# Patient Record
Sex: Male | Born: 1960 | Race: White | Hispanic: No | Marital: Married | Smoking: Never smoker
Health system: Southern US, Community
[De-identification: ages and names within clinical notes are randomized; demographics above are authoritative.]

## PROBLEM LIST (undated history)

## (undated) DIAGNOSIS — N320 Bladder-neck obstruction: Secondary | ICD-10-CM

## (undated) DIAGNOSIS — N401 Enlarged prostate with lower urinary tract symptoms: Secondary | ICD-10-CM

## (undated) DIAGNOSIS — N138 Other obstructive and reflux uropathy: Secondary | ICD-10-CM

## (undated) DIAGNOSIS — R361 Hematospermia: Secondary | ICD-10-CM

## (undated) DIAGNOSIS — N400 Enlarged prostate without lower urinary tract symptoms: Secondary | ICD-10-CM

## (undated) DIAGNOSIS — E663 Overweight: Secondary | ICD-10-CM

## (undated) DIAGNOSIS — H53009 Unspecified amblyopia, unspecified eye: Secondary | ICD-10-CM

## (undated) HISTORY — DX: Unspecified amblyopia, unspecified eye: H53.009

## (undated) HISTORY — DX: Hematospermia: R36.1

## (undated) HISTORY — DX: Other obstructive and reflux uropathy: N13.8

## (undated) HISTORY — DX: Overweight: E66.3

## (undated) HISTORY — DX: Benign prostatic hyperplasia without lower urinary tract symptoms: N40.0

## (undated) HISTORY — DX: Benign prostatic hyperplasia with lower urinary tract symptoms: N40.1

## (undated) HISTORY — PX: HERNIA REPAIR: SHX51

## (undated) HISTORY — DX: Bladder-neck obstruction: N32.0

## (undated) HISTORY — PX: CHOLECYSTECTOMY: SHX55

---

## 2009-02-16 ENCOUNTER — Ambulatory Visit: Payer: Self-pay | Admitting: General Surgery

## 2013-10-06 ENCOUNTER — Ambulatory Visit: Payer: Self-pay | Admitting: Podiatrist

## 2013-10-13 ENCOUNTER — Ambulatory Visit (INDEPENDENT_AMBULATORY_CARE_PROVIDER_SITE_OTHER): Payer: BC Managed Care – PPO | Admitting: Podiatrist

## 2013-10-13 ENCOUNTER — Encounter: Payer: Self-pay | Admitting: Podiatrist

## 2013-10-13 ENCOUNTER — Ambulatory Visit (INDEPENDENT_AMBULATORY_CARE_PROVIDER_SITE_OTHER): Payer: BC Managed Care – PPO

## 2013-10-13 VITALS — BP 138/84 | HR 100 | Resp 16 | Ht 68.0 in | Wt 200.0 lb

## 2013-10-13 DIAGNOSIS — M79609 Pain in unspecified limb: Secondary | ICD-10-CM

## 2013-10-13 DIAGNOSIS — M79672 Pain in left foot: Secondary | ICD-10-CM

## 2013-10-13 DIAGNOSIS — M722 Plantar fascial fibromatosis: Secondary | ICD-10-CM

## 2013-10-13 MED ORDER — PREDNISONE 10 MG PO KIT: 10.0000 mg | PACK | Freq: Four times a day (QID) | ORAL | Status: DC

## 2013-10-13 MED ORDER — MELOXICAM 15 MG PO TABS: 15.0000 mg | ORAL_TABLET | Freq: Every day | ORAL | Status: DC

## 2013-10-13 NOTE — Progress Notes (Signed)
Subjective: Colin Morris presents today with his wife for pain on the left heel. Patient states it's been hurting on and off for about 5 years. He states that his knee walking or movement on the foot is uncomfortable and is especially uncomfortable after sleeping and after sitting for long periods of time. Patient strata water bottle and stretching exercises with no relief in symptoms. He was last seen by me for heel pain on the right foot of which 2 injections were necessary and he recovered well  No changes in past medical history medications or allergies from his previous visit. Review of systems reviewed and negative per patient Objective: Neurovascular status is intact and unchanged to the left foot. Negative Tinel sign is elicited. Pain on palpation is noted plantar medial aspect of the left heel at the insertion of the plantar fascia on the medial calcaneal tubercle. Assessment: Plantar fasciitis left  Plan: Injected the left heel with Kenalog and Marcaine mixture. Order prescription for a steroid Dosepak and meloxicam anti-inflammatory and gave instructions for use. Discussed of heel pain continues in 2-4 weeks hew will need another injection. Stretching and shoe gear changes are dispensed and patient will be seen back as needed.

## 2013-10-13 NOTE — Patient Instructions (Signed)

## 2013-11-30 ENCOUNTER — Encounter: Payer: Self-pay | Admitting: *Deleted

## 2013-12-01 ENCOUNTER — Encounter: Payer: Self-pay | Admitting: Podiatrist

## 2013-12-01 ENCOUNTER — Ambulatory Visit (INDEPENDENT_AMBULATORY_CARE_PROVIDER_SITE_OTHER): Payer: BC Managed Care – PPO | Admitting: Podiatrist

## 2013-12-01 VITALS — BP 142/91 | HR 95 | Resp 16 | Ht 68.0 in | Wt 202.0 lb

## 2013-12-01 DIAGNOSIS — M722 Plantar fascial fibromatosis: Secondary | ICD-10-CM

## 2013-12-01 MED ORDER — TRIAMCINOLONE ACETONIDE 40 MG/ML IJ SUSP
20.0000 mg | Freq: Once | INTRAMUSCULAR | Status: AC
  Administered 2013-12-01: 20 mg

## 2013-12-01 NOTE — Patient Instructions (Signed)
Continue stretching your foot with the stretching exercises.  Use your night splint for added relief.

## 2013-12-01 NOTE — Progress Notes (Signed)
  Subjective: Colin Morris presents today for continued heel pain left heel on the plantar fascial region. He states the last injection was helpful however the pain has returned. He also states he got over-the-counter inserts and he likes the cushioning in his shoe. He states he took the steroid Dosepak but he noticed no improvement in the foot.  Objective: Neurovascular status intact and unchanged. Pain along the plantar medial aspect of the left heel at the insertion of the plantar fascia on the medial calcaneal tubercle continues to be present. Mild inflammation and swelling in this region is also noted.  Assessment: Continued plantar fasciitis left  Plan: Under sterile technique I injected the left heel with Kenalog and Marcaine mixture without complication. I also recommended custom orthotics and he was Scanned at today's visit. I also recommended night splint which she declined. He is instructed to continue stretching exercises into the orthotics come in. We will call these are ready for pick up.   Marlowe Aschoff DPM

## 2014-01-09 ENCOUNTER — Telehealth: Payer: Self-pay | Admitting: *Deleted

## 2014-01-09 NOTE — Telephone Encounter (Signed)
Dr Irving [place], pts wife called wanting to know about inserts. He was scanned for orthotics but no prescription order was filled out. Can you please fill out one for me ASAP and i can get that sent over to [facility]. Thanks.

## 2014-01-14 ENCOUNTER — Emergency Department: Payer: Self-pay | Admitting: Emergency Medicine

## 2014-01-14 ENCOUNTER — Ambulatory Visit: Payer: Self-pay | Admitting: Student

## 2014-01-26 ENCOUNTER — Encounter: Payer: Self-pay | Admitting: Podiatrist

## 2014-01-26 ENCOUNTER — Ambulatory Visit (INDEPENDENT_AMBULATORY_CARE_PROVIDER_SITE_OTHER): Payer: BC Managed Care – PPO | Admitting: Podiatrist

## 2014-01-26 VITALS — BP 132/91 | HR 93 | Resp 16 | Ht 68.0 in | Wt 204.0 lb

## 2014-01-26 DIAGNOSIS — M722 Plantar fascial fibromatosis: Secondary | ICD-10-CM

## 2014-01-26 NOTE — Patient Instructions (Signed)

## 2014-01-27 NOTE — Progress Notes (Signed)
Patient presents today with his wife to pick up his orthotics. After trimming them in length they are noted to fit and contour his arch and foot well. Overall he states he's happy with the inserts.   Of note the patient's wife states that he was quoted insurance pay for the orthotics and then once they were ordered they are not. I will try to see what the issue is with this and get it resolved.

## 2015-04-07 NOTE — Consult Note (Signed)
Admit Diagnosis:   FINGER LACERATION: Onset Date: 14-Jan-2014, Status: Active, Description: FINGER LACERATION    Denies:   Home Medications: Medication Instructions Status  Lortab 5/325 325 mg-5 mg oral tablet 1 tab(s) orally every 6 hours, As Needed - for Pain. No driving or being in dangerous while using this medicine. Active  Keflex 500 mg oral capsule 1 cap(s) orally 4 times a day Active   Radiology Results:  Radiology Results: XRay:    31-Jan-15 11:23, Finger-Index (2nd Digit) Left Hand  Finger-Index (2nd Digit) Left Hand  REASON FOR EXAM:    injury/partial amputation  COMMENTS:       PROCEDURE: DXR - DXR FINGER INDEX 2ND DIGIT LT HA  - Jan 14 2014 11:23AM     CLINICAL DATA:  Partial amputation from a power to was small and to  the left index finger.    EXAM:  LEFT INDEX FINGER 2+V    COMPARISON:  None.    FINDINGS:  There is an oblique, comminuted fracture across the base of the  distal phalanx, which appears to spare the articular surface of the  DIP joint. The distal phalanx from the proximal shaft through the  tuft has been amputated along with soft tissues.    There is no radiopaque foreign body. The joints are normally  aligned.     IMPRESSION:  Comminuted, displaced fracture of the remaining base of the distal  phalanx of the left index finger, with the rest of the distal  phalanx being amputated along with the finger tip soft tissues. No  radiopaque foreign body.      Electronically Signed    By: Amie Portland M.D.    On: 01/14/2014 11:23         Verified By: Domenic Moras, M.D.,  LabUnknown:  PACS Image    NKA: None   General Aspect 54 y/o caucasian male in no acute distress.   Present Illness 54 y/o cacasian male who injured his left index finger finishing a door. Finger got caught in tool and noted immediate pain and bleeding. Patient states he noted how tool took his finger tip off. Patient is right hand dominant.   Case History  and Physical Exam:  Chief Complaint index finger pain, traumatic amputation   Past Medical Health Denies   Past Surgical History Denies   Family History Non-Contributory   HEENT EOM intact, good hearing, head atraumatic   Neck/Nodes Supple   Chest/Lungs No use of accessory muscles, normal non-labored breathing,   Breasts Not examined   Cardiovascular Normal Sinus Rhythm  good distal perfusion   Abdomen Benign   Genitalia Not examined   Rectal Not examined   Musculoskeletal Profuse bleeding from traumatic fingertip amputation left index finger. SILT median, ulnar and radial distribution. Able to perfrom a O-K sign. Good distal perfusion with 2+ radial and ulna pulses. Able to move wrist and fingers, Index finger ROM causing pain.   Neurological Grossly WNL   Skin Warm  Dry    Impression 54 y/o male who sustained traumatic fingertip amputation finishing a door at home.   Plan Procedure:  Patient, side, and injury site confirmed, verbal consent given by patient and family. Hand was cleaned with chlorhexidine and betadine. Digital block applied using lidocaine without epi. Hand was prepped and draped in usual sterile fashion. Wound edges cleaned. Wound irrigated with sterile normal saline. Revision amputation through DIP joint of traumatic left index finger tip amputation performed under local anesthesia. Hemostasis achieved using  electrocautery. Wound closure using local advancement flap using volar skin and subcutanous tissue flap. Local flap closed using absorbable suture. Sterile, bulky dressing applied. No complications.  - Keflex x5 days - Norco for pain control - NWB left hand - RTC in 5-7 days for wound check - Precuations and wound care instructions given to patient and family - Keep dressing dry and clean   Electronic Signatures: Freda MunroSeyler, Tilmon Wisehart M (MD)  (Signed 31-Jan-15 14:36)  Authored: Health Issues, Significant Events - History, Home Medications, Radiology  Results, Allergies, General Aspect/Present Illness, History and Physical Exam, Impression/Plan   Last Updated: 31-Jan-15 14:36 by Freda MunroSeyler, Ron Junco M (MD)

## 2015-06-13 ENCOUNTER — Encounter: Payer: Self-pay | Admitting: General Surgery

## 2015-06-25 ENCOUNTER — Ambulatory Visit: Payer: Self-pay | Admitting: General Surgery

## 2015-06-25 ENCOUNTER — Encounter: Payer: Self-pay | Admitting: General Surgery

## 2015-06-25 ENCOUNTER — Ambulatory Visit (INDEPENDENT_AMBULATORY_CARE_PROVIDER_SITE_OTHER): Payer: BLUE CROSS/BLUE SHIELD | Admitting: General Surgery

## 2015-06-25 VITALS — BP 132/66 | HR 86 | Resp 14 | Ht 68.0 in | Wt 200.0 lb

## 2015-06-25 DIAGNOSIS — Z800 Family history of malignant neoplasm of digestive organs: Secondary | ICD-10-CM | POA: Insufficient documentation

## 2015-06-25 MED ORDER — POLYETHYLENE GLYCOL 3350 17 GM/SCOOP PO POWD: ORAL | Status: DC

## 2015-06-25 NOTE — Progress Notes (Signed)
Patient ID: Colin Morris, male   DOB: 10/02/1961, 54 y.o.   MRN: [id number]  Chief Complaint  Patient presents with  . Colonoscopy    HPI Colin BrasRandall G Morris is a 54 y.o. male here today for a evaluation of a colonoscopy. Last colonoscopy was on 02/16/2009. Patient states no GI problems at this time. Patient states he moves his bowels daily.   HPI  Past Medical History  Diagnosis Date  . Prostate enlargement     Past Surgical History  Procedure Laterality Date  . Cholecystectomy    . Hernia repair      Family History  Problem Relation Age of Onset  . Cancer Father     colon  . Colon polyps Mother     Social History History  Substance Use Topics  . Smoking status: Never Smoker   . Smokeless tobacco: Current User    Types: Chew  . Alcohol Use: 0.0 oz/week    0 Standard drinks or equivalent per week     Comment: RARELY    No Known Allergies  Current Outpatient Prescriptions  Medication Sig Dispense Refill  . aspirin 81 MG tablet Take 81 mg by mouth daily.    . Multiple Vitamins-Minerals (MULTIVITAMIN PO) Take by mouth daily.    . Omega-3 Fatty Acids (FISH OIL PO) Take by mouth daily.    . polyethylene glycol powder (GLYCOLAX/MIRALAX) powder 255 grams one bottle for colonoscopy prep 255 g 0   No current facility-administered medications for this visit.    Review of Systems Review of Systems  Constitutional: Negative.   Respiratory: Negative.   Cardiovascular: Negative.   Gastrointestinal: Negative.     Blood pressure 132/66, pulse 86, resp. rate 14, height 5\' 8"  (1.727 m), weight 200 lb (90.719 kg).  Physical Exam Physical Exam  Constitutional: He is oriented to person, place, and time. He appears well-developed and well-nourished.  HENT:  Mouth/Throat: Oropharynx is clear and moist and mucous membranes are normal. No oral lesions.  Eyes: Conjunctivae are normal. No scleral icterus.  Neck: No thyromegaly present.  Cardiovascular: Normal rate, regular  rhythm and normal heart sounds.   Pulmonary/Chest: Effort normal and breath sounds normal.  Abdominal: Soft. Bowel sounds are normal.  Lymphadenopathy:    He has no cervical adenopathy.  Neurological: He is alert and oriented to person, place, and time.  Skin: Skin is warm and dry.    Data Reviewed Colonoscopy completed 02/16/2009 was notable for diverticulosis.  Assessment     History colon cancer.  Use of dipping tobacco.    Plan    Risks of head and neck cancer with tobacco use reviewed. Patient is aware.     Colonoscopy with possible biopsy/polypectomy prn: Information regarding the procedure, including its potential risks and complications (including but not limited to perforation of the bowel, which may require emergency surgery to repair, and bleeding) was verbally given to the patient. Educational information regarding lower instestinal endoscopy was given to the patient. Written instructions for how to complete the bowel prep using Miralax were provided. The importance of drinking ample fluids to avoid dehydration as a result of the prep emphasized.  [facility] It is okay for patient to continue 81 mg aspirin once daily. This patient has been asked to discontinue fish oil one week prior to procedure.    WGN:[id number],HQIONPCP:Bronstein,David   Earline MayotteByrnett, Jeffrey W 06/25/2015, 9:59 AM

## 2015-06-25 NOTE — Patient Instructions (Addendum)
Colonoscopy A colonoscopy is an exam to look at the entire large intestine (colon). This exam can help find problems such as tumors, polyps, inflammation, and areas of bleeding. The exam takes about 1 hour.  LET [facility] PROVIDER KNOW ABOUT:   Any allergies you have.  All medicines you are taking, including vitamins, herbs, eye drops, creams, and over-the-counter medicines.  Previous problems you or members of your family have had with the use of anesthetics.  Any blood disorders you have.  Previous surgeries you have had.  Medical conditions you have. RISKS AND COMPLICATIONS  Generally, this is a safe procedure. However, as with any procedure, complications can occur. Possible complications include:  Bleeding.  Tearing or rupture of the colon wall.  Reaction to medicines given during the exam.  Infection (rare). BEFORE THE PROCEDURE   Ask your health care provider about changing or stopping your regular medicines.  You may be prescribed an oral bowel prep. This involves drinking a large amount of medicated liquid, starting the day before your procedure. The liquid will cause you to have multiple loose stools until your stool is almost clear or light green. This cleans out your colon in preparation for the procedure.  Do not eat or drink anything else once you have started the bowel prep, unless your health care provider tells you it is safe to do so.  Arrange for someone to drive you home after the procedure. PROCEDURE   You will be given medicine to help you relax (sedative).  You will lie on your side with your knees bent.  A long, flexible tube with a light and camera on the end (colonoscope) will be inserted through the rectum and into the colon. The camera sends video back to a computer screen as it moves through the colon. The colonoscope also releases carbon dioxide gas to inflate the colon. This helps your health care provider see the area better.  During  the exam, your health care provider may take a small tissue sample (biopsy) to be examined under a microscope if any abnormalities are found.  The exam is finished when the entire colon has been viewed. AFTER THE PROCEDURE   Do not drive for 24 hours after the exam.  You may have a small amount of blood in your stool.  You may pass moderate amounts of gas and have mild abdominal cramping or bloating. This is caused by the gas used to inflate your colon during the exam.  Ask when your test results will be ready and how you will get your results. Make sure you get your test results. Document Released: 11/28/2000 Document Revised: 09/21/2013 Document Reviewed: 08/08/2013 [facility] Patient Information 2015 [employer] [place]. This information is not intended to replace advice given to you by your health care provider. Make sure you discuss any questions you have with your health care provider.  Patient [facility]. It is okay for patient to continue 81 mg aspirin once daily. This patient has been asked to discontinue fish oil one week prior to procedure.

## 2015-06-25 NOTE — H&P (Signed)
Patient ID: Colin Morris, male   DOB: 12/15/1960, 54 y.o.   MRN: [id number]  Chief Complaint  Patient presents with  . Colonoscopy    HPI Colin BrasRandall G Wagster is a 54 y.o. male here today for a evaluation of a colonoscopy. Last colonoscopy was on 02/16/2009. Patient states no GI problems at this time. Patient states he moves his bowels daily.   HPI  Past Medical History  Diagnosis Date  . Prostate enlargement     Past Surgical History  Procedure Laterality Date  . Cholecystectomy    . Hernia repair      Family History  Problem Relation Age of Onset  . Cancer Father     colon  . Colon polyps Mother     Social History History  Substance Use Topics  . Smoking status: Never Smoker   . Smokeless tobacco: Current User    Types: Chew  . Alcohol Use: 0.0 oz/week    0 Standard drinks or equivalent per week     Comment: RARELY    No Known Allergies  Current Outpatient Prescriptions  Medication Sig Dispense Refill  . aspirin 81 MG tablet Take 81 mg by mouth daily.    . Multiple Vitamins-Minerals (MULTIVITAMIN PO) Take by mouth daily.    . Omega-3 Fatty Acids (FISH OIL PO) Take by mouth daily.    . polyethylene glycol powder (GLYCOLAX/MIRALAX) powder 255 grams one bottle for colonoscopy prep 255 g 0   No current facility-administered medications for this visit.    Review of Systems Review of Systems  Constitutional: Negative.   Respiratory: Negative.   Cardiovascular: Negative.   Gastrointestinal: Negative.     Blood pressure 132/66, pulse 86, resp. rate 14, height 5\' 8"  (1.727 m), weight 200 lb (90.719 kg).  Physical Exam Physical Exam  Constitutional: He is oriented to person, place, and time. He appears well-developed and well-nourished.  HENT:  Mouth/Throat: Oropharynx is clear and moist and mucous membranes are normal. No oral lesions.  Eyes: Conjunctivae are normal. No scleral icterus.  Neck: No thyromegaly present.  Cardiovascular: Normal rate, regular  rhythm and normal heart sounds.   Pulmonary/Chest: Effort normal and breath sounds normal.  Abdominal: Soft. Bowel sounds are normal.  Lymphadenopathy:    He has no cervical adenopathy.  Neurological: He is alert and oriented to person, place, and time.  Skin: Skin is warm and dry.    Data Reviewed Colonoscopy completed 02/16/2009 was notable for diverticulosis.  Assessment     History colon cancer.  Use of dipping tobacco.    Plan    Risks of head and neck cancer with tobacco use reviewed. Patient is aware.     Colonoscopy with possible biopsy/polypectomy prn: Information regarding the procedure, including its potential risks and complications (including but not limited to perforation of the bowel, which may require emergency surgery to repair, and bleeding) was verbally given to the patient. Educational information regarding lower instestinal endoscopy was given to the patient. Written instructions for how to complete the bowel prep using Miralax were provided. The importance of drinking ample fluids to avoid dehydration as a result of the prep emphasized.  Patient has been [facility]. It is okay for patient to continue 81 mg aspirin once daily. This patient has been asked to discontinue fish oil one week prior to procedure.    WUJ:[id number],NFAOZPCP:Bronstein,David

## 2015-06-26 ENCOUNTER — Encounter: Payer: Self-pay | Admitting: General Surgery

## 2015-08-15 ENCOUNTER — Telehealth: Payer: Self-pay | Admitting: *Deleted

## 2015-08-15 NOTE — Telephone Encounter (Signed)
Patient's wife was contacted today and she confirms no change in medications for the patient since last office visit. She was also reminded to have patient stop fish oil if he has not done so already.   We will proceed with colonoscopy as scheduled for 08-22-15 at [facility].  Patient's wife was instructed to call the office should they have further questions.

## 2015-08-22 ENCOUNTER — Encounter: Payer: Self-pay | Admitting: *Deleted

## 2015-08-22 ENCOUNTER — Encounter: Payer: Self-pay | Admitting: General Surgery

## 2015-08-22 ENCOUNTER — Ambulatory Visit
Admission: RE | Admit: 2015-08-22 | Discharge: 2015-08-22 | Disposition: A | Discharge status: Home / Self Care | Payer: BLUE CROSS/BLUE SHIELD | Source: Ambulatory Visit | Attending: General Surgery | Admitting: General Surgery

## 2015-08-22 ENCOUNTER — Ambulatory Visit: Payer: BLUE CROSS/BLUE SHIELD | Admitting: Certified Registered Nurse Anesthetist

## 2015-08-22 ENCOUNTER — Encounter
Admission: RE | Disposition: A | Discharge status: Home / Self Care | Payer: Self-pay | Source: Ambulatory Visit | Attending: General Surgery

## 2015-08-22 DIAGNOSIS — Z86010 Personal history of colonic polyps: Secondary | ICD-10-CM | POA: Diagnosis not present

## 2015-08-22 DIAGNOSIS — Z1211 Encounter for screening for malignant neoplasm of colon: Secondary | ICD-10-CM | POA: Diagnosis present

## 2015-08-22 DIAGNOSIS — N400 Enlarged prostate without lower urinary tract symptoms: Secondary | ICD-10-CM | POA: Diagnosis not present

## 2015-08-22 DIAGNOSIS — Z800 Family history of malignant neoplasm of digestive organs: Secondary | ICD-10-CM

## 2015-08-22 HISTORY — PX: COLONOSCOPY WITH PROPOFOL: SHX5780

## 2015-08-22 SURGERY — COLONOSCOPY WITH PROPOFOL
Anesthesia: General

## 2015-08-22 MED ORDER — PROPOFOL 10 MG/ML IV BOLUS
INTRAVENOUS | Status: DC | PRN
  Administered 2015-08-22: 50 mg via INTRAVENOUS

## 2015-08-22 MED ORDER — PROPOFOL INFUSION 10 MG/ML OPTIME
INTRAVENOUS | Status: DC | PRN
  Administered 2015-08-22: 160 ug/kg/min via INTRAVENOUS

## 2015-08-22 MED ORDER — SODIUM CHLORIDE 0.9 % IV SOLN
INTRAVENOUS | Status: DC
  Administered 2015-08-22: 1000 mL via INTRAVENOUS

## 2015-08-22 MED ORDER — LIDOCAINE HCL (CARDIAC) 20 MG/ML IV SOLN
INTRAVENOUS | Status: DC | PRN
  Administered 2015-08-22: 60 mg via INTRAVENOUS

## 2015-08-22 NOTE — Anesthesia Preprocedure Evaluation (Signed)
Anesthesia Evaluation  Patient identified by MRN, date of birth, ID band Patient awake    Reviewed: Allergy & Precautions, H&P , NPO status , Patient's Chart, lab work & pertinent test results, reviewed documented beta blocker date and time   History of Anesthesia Complications Negative for: history of anesthetic complications  Airway Mallampati: II  TM Distance: >3 FB Neck ROM: full    Dental no notable dental hx. (+) Teeth Intact   Pulmonary neg pulmonary ROS,    Pulmonary exam normal breath sounds clear to auscultation       Cardiovascular Exercise Tolerance: Good negative cardio ROS Normal cardiovascular exam Rhythm:regular Rate:Normal     Neuro/Psych negative neurological ROS  negative psych ROS   GI/Hepatic negative GI ROS, Neg liver ROS,   Endo/Other  negative endocrine ROS  Renal/GU negative Renal ROS  negative genitourinary   Musculoskeletal   Abdominal   Peds  Hematology negative hematology ROS (+)   Anesthesia Other Findings Past Medical History:   Prostate enlargement                                         Reproductive/Obstetrics negative OB ROS                             Anesthesia Physical Anesthesia Plan  ASA: I  Anesthesia Plan: General   Post-op Pain Management:    Induction:   Airway Management Planned:   Additional Equipment:   Intra-op Plan:   Post-operative Plan:   Informed Consent: I have reviewed the patients History and Physical, chart, labs and discussed the procedure including the risks, benefits and alternatives for the proposed anesthesia with the patient or authorized representative who has indicated his/her understanding and acceptance.   Dental Advisory Given  Plan Discussed with: Anesthesiologist, CRNA and Surgeon  Anesthesia Plan Comments:         Anesthesia Quick Evaluation

## 2015-08-22 NOTE — Transfer of Care (Signed)
Immediate Anesthesia Transfer of Care Note  Patient: Colin Morris  Procedure(s) Performed: Procedure(s): COLONOSCOPY WITH PROPOFOL (N/A)  Patient Location: PACU   Anesthesia Type:General  Level of Consciousness: awake, alert  and oriented  Airway & Oxygen Therapy: Patient Spontanous Breathing and Patient connected to nasal cannula oxygen  Post-op Assessment: Report given to RN and Post -op Vital signs reviewed and stable  Post vital signs: Reviewed and stable  Last Vitals:  Filed Vitals:   08/22/15 0933  BP: 136/80  Pulse: 78  Temp: 36.9 C  Resp: 18  1045 VS: BP 117/75 Pulse 77 Resp 18  Complications: No apparent anesthesia complications

## 2015-08-22 NOTE — Anesthesia Procedure Notes (Signed)
Performed by: Rashi Granier Pre-anesthesia Checklist: Patient identified, Suction available, Emergency Drugs available, Patient being monitored and Timeout performed Patient Re-evaluated:Patient Re-evaluated prior to inductionOxygen Delivery Method: Nasal cannula Intubation Type: IV induction       

## 2015-08-22 NOTE — Op Note (Signed)
[facility] Patient Name: Colin Morris Procedure Date: 08/22/2015 10:18 AM MRN: [id number] Account #: [id number] Date of Birth: [birth year] Admit Type: Outpatient Age: 54 Room: [facility] ENDO ROOM 4 Gender: Male Note Status: Finalized Procedure:         Colonoscopy Indications:       Screening for colorectal malignant neoplasm Providers:         Earline Mayotte, MD Referring MD:      Teena Irani. Terance Hart, MD (Referring MD) Medicines:         Monitored Anesthesia Care Complications:     No immediate complications. Procedure:         Pre-Anesthesia Assessment:                    - Prior to the procedure, a History and Physical was                     performed, and patient medications, allergies and                     sensitivities were reviewed. The patient's tolerance of                     previous anesthesia was reviewed.                    - The risks and benefits of the procedure and the sedation                     options and risks were discussed with the patient. All                     questions were answered and informed consent was obtained.                    After obtaining informed consent, the colonoscope was                     passed under direct vision. Throughout the procedure, the                     patient's blood pressure, pulse, and oxygen saturations                     were monitored continuously. The Olympus CF-Q160AL                     colonoscope (S#. [id number]) was introduced through the anus                     and advanced to the the terminal ileum. The colonoscopy                     was performed without difficulty. The patient tolerated                     the procedure well. The quality of the bowel preparation                     was excellent. Findings:      The perianal and digital rectal examinations were normal.      The entire examined colon appeared normal on direct and retroflexion        views. Impression:        - The entire  examined colon is normal on direct and                     retroflexion views.                    - No specimens collected. Recommendation:    - Repeat colonoscopy in 10 years for screening purposes. Diagnosis Code(s): --- Professional ---                    Z12.11, Encounter for screening for malignant neoplasm of                     colon Earline Mayotte, MD 08/22/2015 10:41:35 AM This report has been signed electronically. Number of Addenda: 0 Note Initiated On: 08/22/2015 10:18 AM Scope Withdrawal Time: 0 hours 10 minutes 1 second  Total Procedure Duration: 0 hours 16 minutes 13 seconds       [facility]

## 2015-08-22 NOTE — H&P (Signed)
OREST DYGERT [id number] [birth year]     HPI: Healthy 54 year old male for screening colonoscopy.  No change in health status since original examination.      No Known Allergies Past Medical History  Diagnosis Date  . Prostate enlargement    Past Surgical History  Procedure Laterality Date  . Cholecystectomy    . Hernia repair     Social History   Social History  . Marital Status: Married    Spouse Name: N/A  . Number of Children: N/A  . Years of Education: N/A   Occupational History  . Not on file.   Social History Main Topics  . Smoking status: Never Smoker   . Smokeless tobacco: Current User    Types: Chew  . Alcohol Use: 0.0 oz/week    0 Standard drinks or equivalent per week     Comment: RARELY  . Drug Use: No  . Sexual Activity: Not on file   Other Topics Concern  . Not on file   Social History Narrative   Social History   Social History Narrative     ROS: Negative.     PE: HEENT: Negative. Lungs: Clear. Cardio: RR. Earline Mayotte 08/22/2015   Assessment/Plan:  Proceed with planned endoscopy.

## 2015-08-24 ENCOUNTER — Encounter: Payer: Self-pay | Admitting: General Surgery

## 2015-08-24 NOTE — Anesthesia Postprocedure Evaluation (Signed)
  Anesthesia Post-op Note  Patient: Colin Morris  Procedure(s) Performed: Procedure(s): COLONOSCOPY WITH PROPOFOL (N/A)  Anesthesia type:General  Patient location: PACU  Post pain: Pain level controlled  Post assessment: Post-op Vital signs reviewed, Patient's Cardiovascular Status Stable, Respiratory Function Stable, Patent Airway and No signs of Nausea or vomiting  Post vital signs: Reviewed and stable  Last Vitals:  Filed Vitals:   08/22/15 1115  BP: 127/94  Pulse: 76  Temp:   Resp: 13    Level of consciousness: awake, alert  and patient cooperative  Complications: No apparent anesthesia complications

## 2015-09-14 ENCOUNTER — Encounter: Payer: Self-pay | Admitting: General Surgery

## 2015-09-28 IMAGING — CR LEFT INDEX FINGER 2+V
1 series · 3 of 3 positions shown · non-contrast
Comparison: None.

CLINICAL DATA: Partial amputation from a power to was small and to
the left index finger.

EXAM:
LEFT INDEX FINGER 2+V

[Series 1: pa · 0.17mm/px · 3 of 3 slices shown]
[im 1/3]
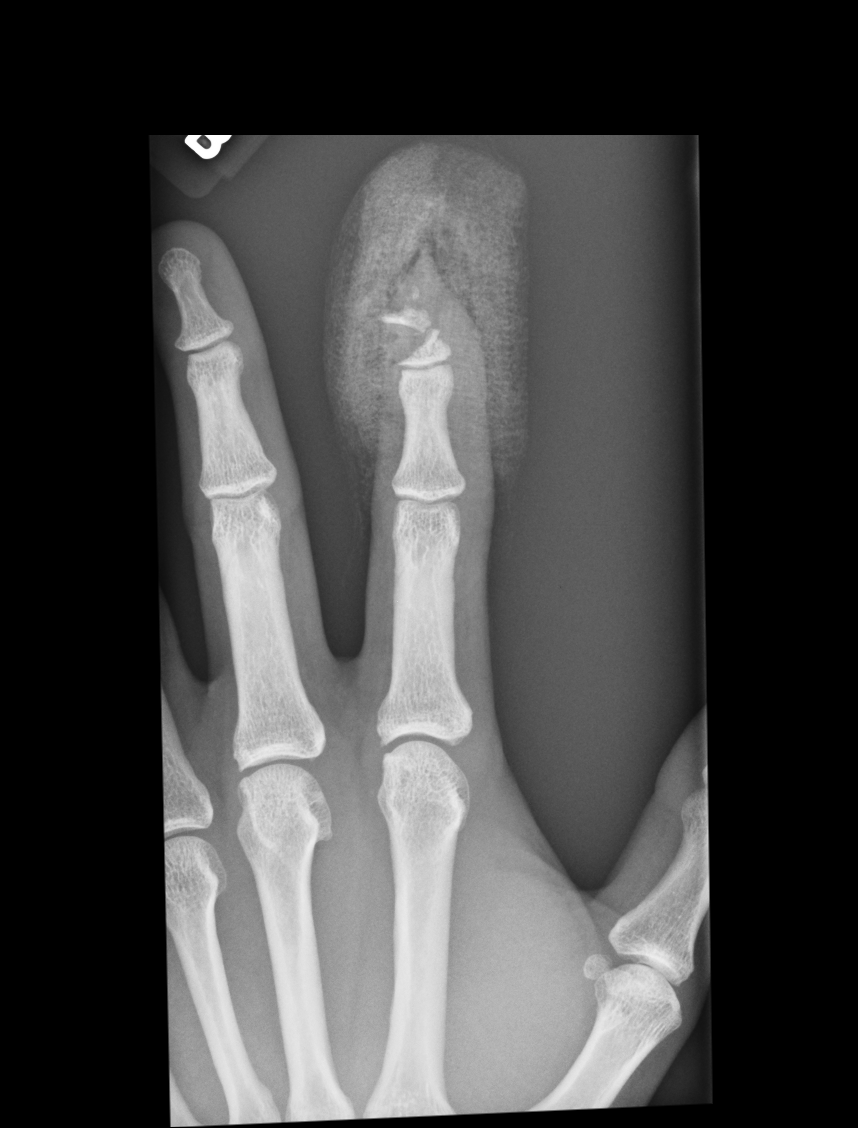
[im 2/3]
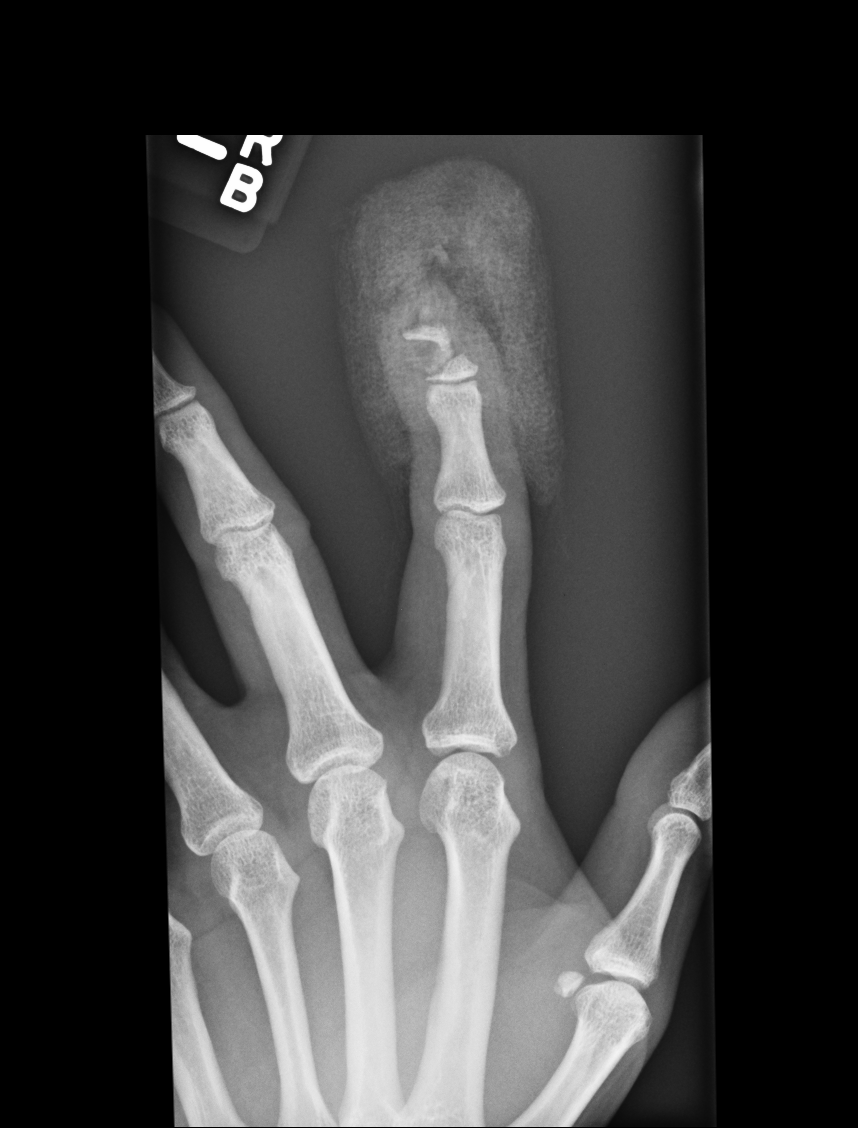
[im 3/3]
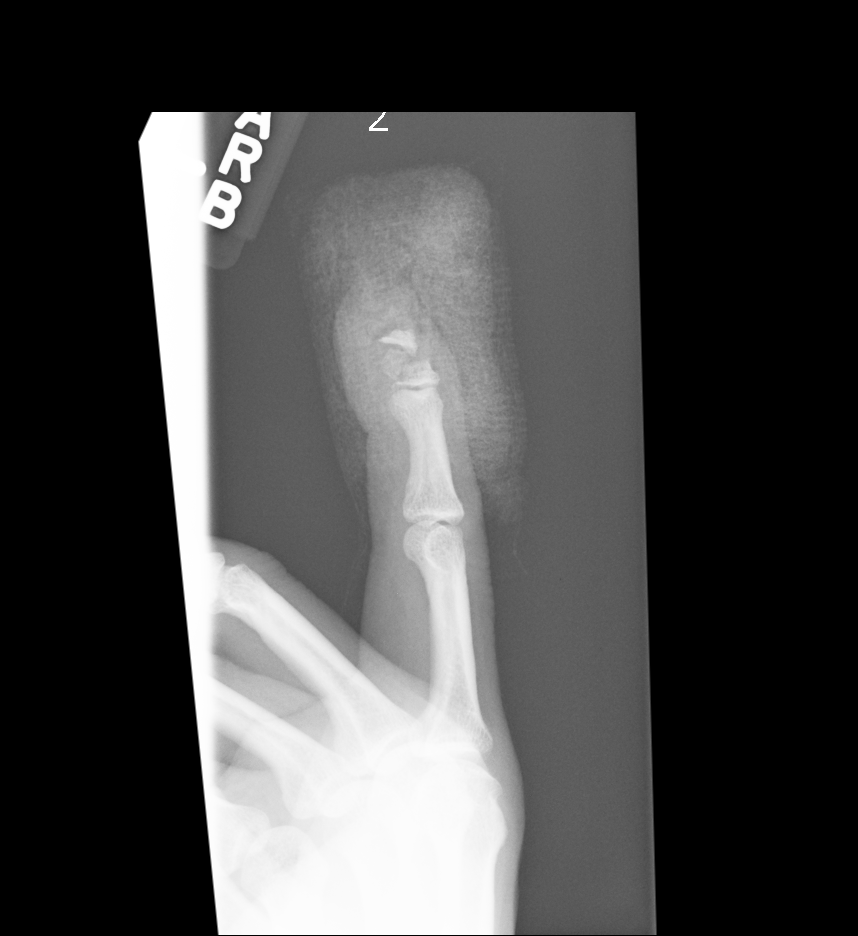

[3 of 3 positions shown; findings below may reference images not displayed]

FINDINGS: There is an oblique, comminuted fracture across the base of the
distal phalanx, which appears to spare the articular surface of the
DIP joint. The distal phalanx from the proximal shaft through the
tuft has been amputated along with soft tissues.

There is no radiopaque foreign body. The joints are normally
aligned.
IMPRESSION: Comminuted, displaced fracture of the remaining base of the distal
phalanx of the left index finger, with the rest of the distal
phalanx being amputated along with the finger tip soft tissues. No
radiopaque foreign body.

## 2016-01-22 ENCOUNTER — Encounter: Payer: Self-pay | Admitting: *Deleted

## 2016-01-25 ENCOUNTER — Encounter: Payer: Self-pay | Admitting: Urology

## 2016-01-25 ENCOUNTER — Ambulatory Visit (INDEPENDENT_AMBULATORY_CARE_PROVIDER_SITE_OTHER): Payer: BLUE CROSS/BLUE SHIELD | Admitting: Urology

## 2016-01-25 VITALS — BP 130/82 | HR 80 | Ht 68.0 in | Wt 198.5 lb

## 2016-01-25 DIAGNOSIS — N401 Enlarged prostate with lower urinary tract symptoms: Secondary | ICD-10-CM | POA: Diagnosis not present

## 2016-01-25 DIAGNOSIS — N138 Other obstructive and reflux uropathy: Secondary | ICD-10-CM

## 2016-01-25 NOTE — Progress Notes (Signed)
01/25/2016 8:42 AM   Colin Morris 10/27/1961 [id number]  Referring provider: Dorothey Baseman, MD [address] [place], [place] [address]  Chief Complaint  Patient presents with  . Benign Prostatic Hypertrophy    1 year follow up    HPI: Patient is a 55 year old Caucasian male with BPH with LUTS who presents today for a yearly follow up.    BPH WITH LUTS His IPSS score today is 3, which is mild lower urinary tract symptomatology. He is delighted with his quality life due to his urinary symptoms.  He denies any dysuria, hematuria or suprapubic pain.   He also denies any recent fevers, chills, nausea or vomiting.  He does not have a family history of PCa.      IPSS      01/25/16 0800       International Prostate Symptom Score   How often have you had the sensation of not emptying your bladder? Less than 1 in 5     How often have you had to urinate less than every two hours? Less than 1 in 5 times     How often have you found you stopped and started again several times when you urinated? Not at All     How often have you found it difficult to postpone urination? Not at All     How often have you had a weak urinary stream? Not at All     How often have you had to strain to start urination? Not at All     How many times did you typically get up at night to urinate? 1 Time     Total IPSS Score 3     Quality of Life due to urinary symptoms   If you were to spend the rest of your life with your urinary condition just the way it is now how would you feel about that? Delighted        Score:  1-7 Mild 8-19 Moderate 20-35 Severe      PMH: Past Medical History  Diagnosis Date  . Prostate enlargement   . Lazy eye   . BPH with obstruction/lower urinary tract symptoms   . Over weight   . Hematospermia   . Bladder outlet obstruction     Surgical History: Past Surgical History  Procedure Laterality Date  . Cholecystectomy    . Hernia repair    . Colonoscopy  with propofol N/A 08/22/2015    Procedure: COLONOSCOPY WITH PROPOFOL;  Surgeon: Earline Mayotte, MD;  Location: [facility] ENDOSCOPY;  Service: Endoscopy;  Laterality: N/A;    Home Medications:    Medication List       This list is accurate as of: 01/25/16  8:42 AM.  Always use your most recent med list.               aspirin 81 MG tablet  Take 81 mg by mouth daily.     FISH OIL PO  Take by mouth daily.     MULTIVITAMIN PO  Take by mouth daily.        Allergies: No Known Allergies  Family History: Family History  Problem Relation Age of Onset  . Cancer Father     colon  . Colon polyps Mother   . Heart disease Father   . Diabetes Mellitus II Father   . Kidney disease Neg Hx   . Prostate cancer Neg Hx     Social History:  reports  that he has never smoked. His smokeless tobacco use includes Chew. He reports that he drinks alcohol. He reports that he does not use illicit drugs.  ROS: UROLOGY Frequent Urination?: No Hard to postpone urination?: No Burning/pain with urination?: No Get up at night to urinate?: No Leakage of urine?: No Urine stream starts and stops?: No Trouble starting stream?: No Do you have to strain to urinate?: No Blood in urine?: No Urinary tract infection?: No Sexually transmitted disease?: No Injury to kidneys or bladder?: No Painful intercourse?: No Weak stream?: No Erection problems?: No Penile pain?: No  Gastrointestinal Nausea?: No Vomiting?: No Indigestion/heartburn?: No Diarrhea?: No Constipation?: No  Constitutional Fever: No Night sweats?: No Weight loss?: No Fatigue?: No  Skin Skin rash/lesions?: No Itching?: No  Eyes Blurred vision?: No Double vision?: No  Ears/Nose/Throat Sore throat?: No Sinus problems?: No  Hematologic/Lymphatic Swollen glands?: No Easy bruising?: No  Cardiovascular Leg swelling?: No Chest pain?: No  Respiratory Cough?: No Shortness of breath?: No  Endocrine Excessive thirst?:  No  Musculoskeletal Back pain?: No Joint pain?: No  Neurological Headaches?: No Dizziness?: No  Psychologic Depression?: No Anxiety?: No  Physical Exam: BP 130/82 mmHg  Pulse 80  Ht  (1.727 m)  Wt 198 lb 8 oz (90.039 kg)  BMI 30.19 kg/m2  Constitutional: Well nourished. Alert and oriented, No acute distress. HEENT: [place] AT, moist mucus membranes. Trachea midline, no masses. Cardiovascular: No clubbing, cyanosis, or edema. Respiratory: Normal respiratory effort, no increased work of breathing. GI: Abdomen is soft, non tender, non distended, no abdominal masses. Liver and spleen not palpable.  No hernias appreciated.  Stool sample for occult testing is not indicated.   GU: No CVA tenderness.  No bladder fullness or masses.  Patient with circumcised phallus.  Urethral meatus is patent.  No penile discharge. No penile lesions or rashes. Scrotum without lesions, cysts, rashes and/or edema.  Testicles are located scrotally bilaterally. No masses are appreciated in the testicles. Left and right epididymis are normal. Rectal: Patient with  normal sphincter tone. Anus and perineum without scarring or rashes. No rectal masses are appreciated. Prostate is approximately 40 grams, deep median sulcus, no nodules are appreciated. Seminal vesicles are normal. Skin: No rashes, bruises or suspicious lesions. Lymph: No cervical or inguinal adenopathy. Neurologic: Grossly intact, no focal deficits, moving all 4 extremities. Psychiatric: Normal mood and affect.  Laboratory Data: PSA History  1.3 ng/mL on 07/08/2013  0.8 ng/mL on 11/17/2013  0.7 ng/mL on 05/18/2014  Assessment & Plan:    1. BPH with obstruction/lower urinary tract symptoms with LUTS:   IPSS score is 3/0.  We will continue to monitor.  We will repeat IPSS score, PSA and exam in one year.   - PSA   Return in about 1 year (around 01/24/2017) for IPSS score and exam.  These notes generated with voice recognition software. I  apologize for typographical errors.  Michiel Cowboy, PA-C  [facility] [address], [address] [place], [place] [address] [phone]

## 2016-01-26 LAB — PSA: PROSTATE SPECIFIC AG, SERUM: 1.5 ng/mL (ref 0.0–4.0)

## 2016-01-28 ENCOUNTER — Telehealth: Payer: Self-pay

## 2016-01-28 NOTE — Telephone Encounter (Signed)
-----   Message from Harle Battiest, PA-C sent at 01/26/2016  6:37 PM EST ----- PSA is normal. We will see him next year.

## 2016-01-28 NOTE — Telephone Encounter (Signed)
Spoke with Colin Morris in reference to PSA results. Colin Morris voiced understanding.  

## 2017-01-30 ENCOUNTER — Other Ambulatory Visit: Payer: BLUE CROSS/BLUE SHIELD

## 2017-02-05 ENCOUNTER — Ambulatory Visit: Payer: BLUE CROSS/BLUE SHIELD | Admitting: Urology

## 2017-02-06 ENCOUNTER — Ambulatory Visit: Payer: BLUE CROSS/BLUE SHIELD | Admitting: Urology

## 2017-02-17 ENCOUNTER — Other Ambulatory Visit: Payer: Self-pay | Admitting: Internal Medicine

## 2017-02-17 DIAGNOSIS — N183 Chronic kidney disease, stage 3 unspecified: Secondary | ICD-10-CM

## 2017-03-06 ENCOUNTER — Ambulatory Visit: Payer: BLUE CROSS/BLUE SHIELD

## 2020-08-15 ENCOUNTER — Other Ambulatory Visit: Payer: Self-pay | Admitting: Sleep Medicine

## 2020-08-15 ENCOUNTER — Other Ambulatory Visit: Payer: Self-pay

## 2020-08-15 DIAGNOSIS — I471 Supraventricular tachycardia, unspecified: Secondary | ICD-10-CM

## 2020-08-17 LAB — NOVEL CORONAVIRUS, NAA: SARS-CoV-2, NAA: NOT DETECTED
